# Patient Record
Sex: Male | Born: 2000 | Race: Black or African American | Hispanic: No | Marital: Single
Health system: Southern US, Community
[De-identification: ages and names within clinical notes are randomized; demographics above are authoritative.]

## PROBLEM LIST (undated history)

## (undated) DIAGNOSIS — J45909 Unspecified asthma, uncomplicated: Secondary | ICD-10-CM

## (undated) DIAGNOSIS — L309 Dermatitis, unspecified: Secondary | ICD-10-CM

## (undated) HISTORY — PX: TONSILLECTOMY: SUR1361

---
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
## 2020-02-27 ENCOUNTER — Emergency Department (HOSPITAL_COMMUNITY)
Admission: EM | Admit: 2020-02-27 | Discharge: 2020-02-27 | Disposition: A | Discharge status: Home / Self Care | Payer: Medicaid Other | Attending: Emergency Medicine | Admitting: Emergency Medicine

## 2020-02-27 ENCOUNTER — Other Ambulatory Visit: Payer: Self-pay

## 2020-02-27 ENCOUNTER — Encounter (HOSPITAL_COMMUNITY): Payer: Self-pay | Admitting: Emergency Medicine

## 2020-02-27 DIAGNOSIS — N4889 Other specified disorders of penis: Secondary | ICD-10-CM | POA: Insufficient documentation

## 2020-02-27 DIAGNOSIS — Z202 Contact with and (suspected) exposure to infections with a predominantly sexual mode of transmission: Secondary | ICD-10-CM | POA: Diagnosis not present

## 2020-02-27 LAB — URINALYSIS, ROUTINE W REFLEX MICROSCOPIC
Bacteria, UA: NONE SEEN
Bilirubin Urine: NEGATIVE
Glucose, UA: NEGATIVE mg/dL
Hgb urine dipstick: NEGATIVE
Ketones, ur: NEGATIVE mg/dL
Leukocytes,Ua: NEGATIVE
Nitrite: NEGATIVE
Protein, ur: 30 mg/dL — AB
Specific Gravity, Urine: 1.028 (ref 1.005–1.030)
pH: 6 (ref 5.0–8.0)

## 2020-02-27 LAB — RAPID HIV SCREEN (HIV 1/2 AB+AG)
HIV 1/2 Antibodies: NONREACTIVE
HIV-1 P24 Antigen - HIV24: NONREACTIVE

## 2020-02-27 MED ORDER — DOXYCYCLINE HYCLATE 100 MG PO TABS
100.0000 mg | ORAL_TABLET | Freq: Once | ORAL | Status: AC
  Administered 2020-02-27: 17:00:00 100 mg via ORAL
  Filled 2020-02-27: qty 1

## 2020-02-27 MED ORDER — CEFTRIAXONE SODIUM 1 G IJ SOLR
500.0000 mg | Freq: Once | INTRAMUSCULAR | Status: AC
  Administered 2020-02-27: 500 mg via INTRAMUSCULAR
  Filled 2020-02-27: qty 10

## 2020-02-27 MED ORDER — STERILE WATER FOR INJECTION IJ SOLN
INTRAMUSCULAR | Status: AC
  Administered 2020-02-27: 10 mL
  Filled 2020-02-27: qty 10

## 2020-02-27 MED ORDER — DOXYCYCLINE HYCLATE 100 MG PO CAPS: 100.0000 mg | ORAL_CAPSULE | Freq: Two times a day (BID) | ORAL | 0 refills | Status: AC

## 2020-02-27 NOTE — ED Notes (Signed)
ED Provider at bedside. 

## 2020-02-27 NOTE — ED Notes (Signed)
Patient given water per PA request.

## 2020-02-27 NOTE — ED Provider Notes (Signed)
[name]-EMERGENCY DEPT Provider Note   CSN: [id number] Arrival date & time: 02/27/20  1446     History Chief Complaint  Patient presents with  . Exposure to STD    Daniel Cole is a 19 y.o. male with a past medical history significant for asthma and eczema who presents to the ED due to an STD exposure. Patient admits to being sexually active with 2 females without protection and was informed yesterday one partner tested positive for an STD. Patient admits to history of gonorrhea and chlamydia in the past. Denies abdominal pain, scrotal pain, testicular swelling, pain with defecation, and dysuria. Denies fever and chills. He endorses tingling at the tip of his penis after urination for the past week and a half. He has requested to be tested for gonorrhea, chlamydia, HIV, and syphilis. Denies penile discharge.       Past Medical History:  Diagnosis Date  . Asthma   . Eczema     Patient Active Problem List   Diagnosis Date Noted  . Elevated blood pressure reading 02/01/2017  . Asthma exacerbation 01/31/2017  . Asthma 01/30/2017  . Exacerbation of asthma 01/30/2017    Past Surgical History:  Procedure Laterality Date  . TONSILLECTOMY         History reviewed. No pertinent family history.  Social History   Tobacco Use  . Smoking status: Never Smoker  . Smokeless tobacco: Never Used  Substance Use Topics  . Alcohol use: No  . Drug use: Yes    Types: Marijuana    Home Medications Prior to Admission medications   Medication Sig Start Date End Date Taking? Authorizing Provider  albuterol (PROAIR HFA) 108 (90 Base) MCG/ACT inhaler Inhale 2 puffs into the lungs every 4 (four) hours as needed for wheezing or shortness of breath.    [provider]  beclomethasone (QVAR) 80 MCG/ACT inhaler Inhale 2 puffs into the lungs 2 (two) times daily as needed (shortness of breath).     [provider]  cetirizine (ZYRTEC) 10 MG tablet Take  10 mg by mouth daily as needed for allergies.  05/19/18   [provider]  ciprofloxacin (CIPRO) 500 MG tablet Take 1 tablet (500 mg total) by mouth 2 (two) times daily. 06/19/18   Lorre Nick, MD  doxycycline (VIBRAMYCIN) 100 MG capsule Take 1 capsule (100 mg total) by mouth 2 (two) times daily for 7 days. 02/27/20 03/05/20  Mannie Stabile, PA-C  FLOVENT HFA 220 MCG/ACT inhaler Take 2 puffs by mouth 2 (two) times daily as needed (shortness of breath).  05/19/18   [provider]  ibuprofen (ADVIL,MOTRIN) 200 MG tablet Take 400 mg by mouth every 6 (six) hours as needed for headache.     [provider]  polyethylene glycol powder (MIRALAX) powder Mix 1 capful powder in 6-8 oz drink once daily for 3 days then as needed for flank pain/constipation 12/10/18   Ree Shay, MD    Allergies    Eggs or egg-derived products, Orange juice [orange oil], Other, and Peanut-containing drug products  Review of Systems   Review of Systems  Constitutional: Negative for chills and fever.  Gastrointestinal: Negative for abdominal pain, diarrhea, nausea and vomiting.  Genitourinary: Positive for penile pain (tingling sensation). Negative for discharge, dysuria, genital sores, penile swelling, scrotal swelling and testicular pain.  Musculoskeletal: Negative for back pain.    Physical Exam Updated Vital Signs BP 131/83   Pulse 60   Temp 98.1 F (36.7  C) (Oral)   Resp 15   Ht 5\' 11"  (1.803 m)   Wt 74.8 kg   SpO2 99%   BMI 23.01 kg/m   Physical Exam Vitals and nursing note reviewed. Exam conducted with a chaperone present.  Constitutional:      General: He is not in acute distress.    Appearance: He is not ill-appearing.  HENT:     Head: Normocephalic.  Eyes:     Conjunctiva/sclera: Conjunctivae normal.  Cardiovascular:     Rate and Rhythm: Normal rate and regular rhythm.     Pulses: Normal pulses.     Heart sounds: Normal heart sounds. No murmur. No friction rub. No  gallop.   Pulmonary:     Effort: Pulmonary effort is normal.     Breath sounds: Normal breath sounds.  Abdominal:     General: Abdomen is flat. Bowel sounds are normal. There is no distension.     Palpations: Abdomen is soft.     Tenderness: There is no abdominal tenderness. There is no guarding or rebound.     Comments: Abdomen soft, nondistended, nontender to palpation in all quadrants without guarding or peritoneal signs. No rebound.   Genitourinary:    Penis: Normal and circumcised.      Testes: Normal.        Right: Mass, tenderness or swelling not present.        Left: Mass, tenderness or swelling not present.     Epididymis:     Right: Normal.     Left: Normal.     Comments: Normal circumcised penis with no lesions or discharge. No testicular tenderness or swelling.  Musculoskeletal:     Cervical back: Neck supple.     Comments: Able to move all 4 extremities without difficulty.   Skin:    General: Skin is warm and dry.  Neurological:     General: No focal deficit present.     Mental Status: He is alert.     ED Results / Procedures / Treatments   Labs (all labs ordered are listed, but only abnormal results are displayed) Labs Reviewed  URINALYSIS, ROUTINE W REFLEX MICROSCOPIC - Abnormal; Notable for the following components:      Result Value   Protein, ur 30 (*)    All other components within normal limits  RAPID HIV SCREEN (HIV 1/2 AB+AG)  RPR  GC/CHLAMYDIA PROBE AMP ([place]) NOT AT [facility]    EKG None  Radiology No results found.  Procedures Procedures (including critical care time)  Medications Ordered in ED Medications  cefTRIAXone (ROCEPHIN) injection 500 mg (has no administration in time range)  doxycycline (VIBRA-TABS) tablet 100 mg (has no administration in time range)    ED Course  I have reviewed the triage vital signs and the nursing notes.  Pertinent labs & imaging results that were available during my care of the patient were reviewed  by me and considered in my medical decision making (see chart for details).    MDM Rules/Calculators/A&P                     19 year old male presents to the ED for STD testing and treatment after an STD exposure. Patient is afebrile without abdominal tenderness, abdominal pain or painful bowel movements to indicate prostatitis.  No tenderness to palpation of the testes or epididymis to suggest orchitis or epididymitis.  STD cultures obtained including HIV, syphilis, gonorrhea and chlamydia. UA negative for signs of infection.  Patient to be discharged with instructions to follow up with PCP. Discussed importance of using protection when sexually active. Pt understands that they have GC/Chlamydia cultures pending and that they will need to inform all sexual partners if results return positive. Patient has been treated prophylactically with doxycycline and Rocephin given new CDC guidelines. Strict ED precautions discussed with patient. Patient states understanding and agrees to plan. Patient discharged home in no acute distress and stable vitals  Final Clinical Impression(s) / ED Diagnoses Final diagnoses:  STD exposure    Rx / DC Orders ED Discharge Orders         Ordered    doxycycline (VIBRAMYCIN) 100 MG capsule  2 times daily     02/27/20 1622           Jesusita Oka 02/27/20 1625    Bethann Berkshire, MD 02/28/20 1200

## 2020-02-27 NOTE — ED Triage Notes (Signed)
Pt reports exposure to STD. Reports had sex last week and the partner contacted him today stating she has an STD. States he wants to be tested for Chlamydia and Gonorrhea. Hx of STDs and intercourse with multiple partners. Denies pain, discharge, or odor. Reports dark colored urine.

## 2020-02-27 NOTE — Discharge Instructions (Signed)
As discussed, you were tested for STDs today. You will receive your results in the next few days. You were also treated in the ED. You must finish 7 days worth of doxycyline to finish treatment. Take antibiotic twice a day for 7 days. Finish all antibiotics. Use protection with intercourse. If your test results are positive, your partners will needed to be treated as well. I have included the number for [facility]. Call to schedule an appointment to establish care. Return to the ER for new or worsening symptoms.

## 2020-02-28 LAB — RPR: RPR Ser Ql: NONREACTIVE

## 2020-02-29 LAB — GC/CHLAMYDIA PROBE AMP (~~LOC~~) NOT AT ARMC
Chlamydia: NEGATIVE
Neisseria Gonorrhea: NEGATIVE

## 2020-04-14 ENCOUNTER — Encounter (HOSPITAL_COMMUNITY): Payer: Self-pay | Admitting: Emergency Medicine

## 2020-04-14 ENCOUNTER — Other Ambulatory Visit: Payer: Self-pay

## 2020-04-14 ENCOUNTER — Emergency Department (HOSPITAL_COMMUNITY)
Admission: EM | Admit: 2020-04-14 | Discharge: 2020-04-14 | Disposition: A | Discharge status: Home / Self Care | Payer: Medicaid Other | Attending: Emergency Medicine | Admitting: Emergency Medicine

## 2020-04-14 DIAGNOSIS — Z5321 Procedure and treatment not carried out due to patient leaving prior to being seen by health care provider: Secondary | ICD-10-CM | POA: Insufficient documentation

## 2020-04-14 DIAGNOSIS — R1030 Lower abdominal pain, unspecified: Secondary | ICD-10-CM | POA: Insufficient documentation

## 2020-04-14 DIAGNOSIS — Z113 Encounter for screening for infections with a predominantly sexual mode of transmission: Secondary | ICD-10-CM | POA: Diagnosis not present

## 2020-04-14 NOTE — ED Triage Notes (Signed)
Pt reports wanting to be checked for STD due to some groin pain starting today. States he was seen here about 2 months ago and treated for STD. No pain at the moment because drinking water helped.

## 2020-04-14 NOTE — ED Notes (Signed)
Mother to xray via stretcher with patient/tech

## 2020-04-16 ENCOUNTER — Encounter (HOSPITAL_COMMUNITY): Payer: Self-pay | Admitting: Emergency Medicine

## 2020-04-16 ENCOUNTER — Emergency Department (HOSPITAL_COMMUNITY)
Admission: EM | Admit: 2020-04-16 | Discharge: 2020-04-16 | Disposition: A | Discharge status: Home / Self Care | Payer: Medicaid Other | Attending: Emergency Medicine | Admitting: Emergency Medicine

## 2020-04-16 ENCOUNTER — Other Ambulatory Visit: Payer: Self-pay

## 2020-04-16 DIAGNOSIS — N50812 Left testicular pain: Secondary | ICD-10-CM | POA: Diagnosis not present

## 2020-04-16 DIAGNOSIS — Z79899 Other long term (current) drug therapy: Secondary | ICD-10-CM | POA: Diagnosis not present

## 2020-04-16 DIAGNOSIS — J45909 Unspecified asthma, uncomplicated: Secondary | ICD-10-CM | POA: Diagnosis not present

## 2020-04-16 MED ORDER — CEFTRIAXONE SODIUM 1 G IJ SOLR
500.0000 mg | Freq: Once | INTRAMUSCULAR | Status: AC
  Administered 2020-04-16: 15:00:00 500 mg via INTRAMUSCULAR
  Filled 2020-04-16: qty 10

## 2020-04-16 MED ORDER — DOXYCYCLINE HYCLATE 100 MG PO CAPS: 100.0000 mg | ORAL_CAPSULE | Freq: Two times a day (BID) | ORAL | 0 refills | Status: AC

## 2020-04-16 MED ORDER — LIDOCAINE HCL 2 % IJ SOLN
INTRAMUSCULAR | Status: AC
  Administered 2020-04-16: 400 mg
  Filled 2020-04-16: qty 20

## 2020-04-16 NOTE — Discharge Instructions (Addendum)
You have been treated presumptively today for gonorrhea and you have been prescribed medication to cover for chlamydia.  Please take your antibiotic, as prescribed.  Take with food.  You have been tested today for gonorrhea and chlamydia. These results will be available in approximately 3 days. You may check your MyChart account for results. Please inform all sexual partners of positive results and that they should be tested and treated as well.  Please wait 2 weeks and be sure that you and your partners are symptom free before returning to sexual activity. Please use protection with every sexual encounter.  Follow Up: Please followup with your primary doctor in 3 days for discussion of your diagnoses and further evaluation after today's visit; if you do not have a primary care doctor use the resource guide provided to find one; Please return to the ER for worsening symptoms, high fevers or persistent vomiting.  

## 2020-04-16 NOTE — ED Provider Notes (Signed)
[facility] DEPT Provider Note   CSN: [id number] Arrival date & time: 04/16/20  1312     History Chief Complaint  Patient presents with  . Testicle Pain    left    Daniel Cole is a 19 y.o. male with no relevant past medical history presents to the ED with 3-day history of left testicular discomfort.  Patient states that he has had unprotected sexual intercourse with multiple women in the past couple of months and he suspects STI.  He states that it feels as though he is getting "punched in the testicles".  His symptoms come and go and are worse with ambulation and sitting down.  He feels as though his left testicular discomfort radiates towards his lower abdomen.  He was recently evaluated on 02/27/2020 for STI encounter, but states that he was not symptomatic at that time.  He was tested for HIV and syphilis at that time as well and is not requesting repeat testing at this time.  He denies any fevers or chills, abdominal pain, nausea vomiting, penile discharge, testicular or scrotal swelling, hematochezia, pain with defecation, or other symptoms.  HPI     Past Medical History:  Diagnosis Date  . Asthma   . Eczema     Patient Active Problem List   Diagnosis Date Noted  . Elevated blood pressure reading 02/01/2017  . Asthma exacerbation 01/31/2017  . Asthma 01/30/2017  . Exacerbation of asthma 01/30/2017    Past Surgical History:  Procedure Laterality Date  . TONSILLECTOMY         No family history on file.  Social History   Tobacco Use  . Smoking status: Never Smoker  . Smokeless tobacco: Never Used  Substance Use Topics  . Alcohol use: No  . Drug use: Yes    Types: Marijuana    Home Medications Prior to Admission medications   Medication Sig Start Date End Date Taking? Authorizing Provider  albuterol (PROAIR HFA) 108 (90 Base) MCG/ACT inhaler Inhale 2 puffs into the lungs every 4 (four) hours as needed for wheezing or shortness  of breath.    [provider]  beclomethasone (QVAR) 80 MCG/ACT inhaler Inhale 2 puffs into the lungs 2 (two) times daily as needed (shortness of breath).     [provider]  cetirizine (ZYRTEC) 10 MG tablet Take 10 mg by mouth daily as needed for allergies.  05/19/18   [provider]  ciprofloxacin (CIPRO) 500 MG tablet Take 1 tablet (500 mg total) by mouth 2 (two) times daily. 06/19/18   Lacretia Leigh, MD  doxycycline (VIBRAMYCIN) 100 MG capsule Take 1 capsule (100 mg total) by mouth 2 (two) times daily. 04/16/20   Corena Herter, PA-C  FLOVENT HFA 220 MCG/ACT inhaler Take 2 puffs by mouth 2 (two) times daily as needed (shortness of breath).  05/19/18   [provider]  ibuprofen (ADVIL,MOTRIN) 200 MG tablet Take 400 mg by mouth every 6 (six) hours as needed for headache.     [provider]  polyethylene glycol powder (MIRALAX) powder Mix 1 capful powder in 6-8 oz drink once daily for 3 days then as needed for flank pain/constipation 12/10/18   Harlene Salts, MD    Allergies    Eggs or egg-derived products, Orange juice [orange oil], Other, and Peanut-containing drug products  Review of Systems   Review of Systems  All other systems reviewed and are negative.   Physical Exam Updated Vital Signs BP (!) 155/98  Pulse 67   Temp 98.7 F (37.1 C) (Oral)   Resp 17   SpO2 100%   Physical Exam Vitals and nursing note reviewed. Exam conducted with a chaperone present.  Constitutional:      General: He is not in acute distress.    Appearance: Normal appearance. He is not ill-appearing.  HENT:     Head: Normocephalic and atraumatic.  Eyes:     General: No scleral icterus.    Conjunctiva/sclera: Conjunctivae normal.  Cardiovascular:     Rate and Rhythm: Normal rate and regular rhythm.     Pulses: Normal pulses.     Heart sounds: Normal heart sounds.  Pulmonary:     Effort: Pulmonary effort is normal.     Breath sounds: Normal breath  sounds.  Abdominal:     General: Abdomen is flat. There is no distension.     Palpations: Abdomen is soft.     Tenderness: There is no abdominal tenderness. There is no guarding.  Genitourinary:    Comments: GU exam: No high riding testicle.  Cremasteric reflex intact.  No significant swelling or other overlying skin changes.  Negative Prehn sign.  No penile discharge. Musculoskeletal:     Cervical back: Normal range of motion and neck supple. No rigidity.  Skin:    General: Skin is dry.     Capillary Refill: Capillary refill takes less than 2 seconds.  Neurological:     Mental Status: He is alert and oriented to person, place, and time.     GCS: GCS eye subscore is 4. GCS verbal subscore is 5. GCS motor subscore is 6.  Psychiatric:        Mood and Affect: Mood normal.        Behavior: Behavior normal.        Thought Content: Thought content normal.     ED Results / Procedures / Treatments   Labs (all labs ordered are listed, but only abnormal results are displayed) Labs Reviewed  GC/CHLAMYDIA PROBE AMP ([place]) NOT AT [facility]    EKG None  Radiology No results found.  Procedures Procedures (including critical care time)  Medications Ordered in ED Medications  cefTRIAXone (ROCEPHIN) injection 500 mg (500 mg Intramuscular Given 04/16/20 1452)  lidocaine (XYLOCAINE) 2 % (with pres) injection (400 mg  Given 04/16/20 1452)    ED Course  I have reviewed the triage vital signs and the nursing notes.  Pertinent labs & imaging results that were available during my care of the patient were reviewed by me and considered in my medical decision making (see chart for details).    MDM Rules/Calculators/A&P                      Patient is afebrile without abdominal tenderness, abdominal pain or painful bowel movements to indicate prostatitis.  No tenderness to palpation of the testes or epididymis to suggest orchitis or epididymitis.  STD cultures obtained including gonorrhea and  chlamydia. Patient to be discharged with instructions to follow up with PCP. Discussed importance of using protection when sexually active. Pt understands that they have GC/Chlamydia cultures pending and that they will need to inform all sexual partners if results return positive. Patient has been treated prophylactically with Rocephin and prescribed doxycycline.   Low suspicion for torsion at this time.  Patient is resting comfortably on exam.  Do not feel as though imaging or lab work-up is warranted at this time.  Will treat for epididymitis encourage patient  to follow-up with his primary care provider for ongoing evaluation management.  Discussed safe sex practices with the patient.  Strict ED return precautions discussed.  Patient voices understanding and is agreeable to the plan.  Final Clinical Impression(s) / ED Diagnoses Final diagnoses:  Testicular pain, left    Rx / DC Orders ED Discharge Orders         Ordered    doxycycline (VIBRAMYCIN) 100 MG capsule  2 times daily     04/16/20 1441           Elvera Maria 04/16/20 1459    Derwood Kaplan, MD 04/17/20 [id number]

## 2020-04-16 NOTE — ED Triage Notes (Signed)
Pt reports left testicle pains for couple days. Reports hx being treated for STDs before. Was told by his friend that could be another STI. Denies discharge, drainage or urinary problems.

## 2020-04-17 LAB — GC/CHLAMYDIA PROBE AMP (~~LOC~~) NOT AT ARMC
Chlamydia: NEGATIVE
Comment: NEGATIVE
Comment: NORMAL
Neisseria Gonorrhea: NEGATIVE

## 2020-05-09 ENCOUNTER — Emergency Department (HOSPITAL_COMMUNITY)
Admission: EM | Admit: 2020-05-09 | Discharge: 2020-05-26 | Disposition: E | Payer: Medicaid Other | Attending: Emergency Medicine | Admitting: Emergency Medicine

## 2020-05-09 DIAGNOSIS — S41102A Unspecified open wound of left upper arm, initial encounter: Secondary | ICD-10-CM | POA: Insufficient documentation

## 2020-05-09 DIAGNOSIS — Y939 Activity, unspecified: Secondary | ICD-10-CM | POA: Diagnosis not present

## 2020-05-09 DIAGNOSIS — Y999 Unspecified external cause status: Secondary | ICD-10-CM | POA: Insufficient documentation

## 2020-05-09 DIAGNOSIS — S21119A Laceration without foreign body of unspecified front wall of thorax without penetration into thoracic cavity, initial encounter: Secondary | ICD-10-CM | POA: Diagnosis present

## 2020-05-09 DIAGNOSIS — Y9289 Other specified places as the place of occurrence of the external cause: Secondary | ICD-10-CM | POA: Insufficient documentation

## 2020-05-09 DIAGNOSIS — W3400XA Accidental discharge from unspecified firearms or gun, initial encounter: Secondary | ICD-10-CM

## 2020-05-10 ENCOUNTER — Encounter (HOSPITAL_COMMUNITY): Payer: Self-pay

## 2020-05-10 MED ORDER — SODIUM BICARBONATE 8.4 % IV SOLN
INTRAVENOUS | Status: AC | PRN
  Administered 2020-05-09: 50 meq via INTRAVENOUS

## 2020-05-10 MED ORDER — EPINEPHRINE 1 MG/10ML IJ SOSY
PREFILLED_SYRINGE | INTRAMUSCULAR | Status: AC | PRN
  Administered 2020-05-09: 1 via INTRAVENOUS

## 2020-05-10 MED FILL — Medication: Qty: 1 | Status: AC

## 2020-05-11 LAB — PREPARE FRESH FROZEN PLASMA
Unit division: 0
Unit division: 0
Unit division: 0
Unit division: 0

## 2020-05-11 LAB — BPAM FFP
Blood Product Expiration Date: 202105242359
Blood Product Expiration Date: 202105282359
Blood Product Expiration Date: 202105292359
Blood Product Expiration Date: 202105292359
ISSUE DATE / TIME: 202105152333
ISSUE DATE / TIME: 202105152333
ISSUE DATE / TIME: 202105152353
ISSUE DATE / TIME: 202105152353
Unit Type and Rh: 600
Unit Type and Rh: 6200
Unit Type and Rh: 6200
Unit Type and Rh: 6200

## 2020-05-12 ENCOUNTER — Encounter (HOSPITAL_COMMUNITY): Payer: Self-pay | Admitting: Emergency Medicine

## 2020-05-12 LAB — BPAM RBC
Blood Product Expiration Date: 202105292359
Blood Product Expiration Date: 202105292359
Blood Product Expiration Date: 202106112359
Blood Product Expiration Date: 202106132359
Blood Product Expiration Date: 202106172359
Blood Product Expiration Date: 202106172359
ISSUE DATE / TIME: 202105152330
ISSUE DATE / TIME: 202105152330
ISSUE DATE / TIME: 202105152333
ISSUE DATE / TIME: 202105152333
ISSUE DATE / TIME: 202105152353
ISSUE DATE / TIME: 202105152353
Unit Type and Rh: 5100
Unit Type and Rh: 5100
Unit Type and Rh: 5100
Unit Type and Rh: 5100
Unit Type and Rh: 5100
Unit Type and Rh: 5100

## 2020-05-12 LAB — TYPE AND SCREEN
Unit division: 0
Unit division: 0
Unit division: 0
Unit division: 0
Unit division: 0
Unit division: 0

## 2020-05-26 NOTE — ED Notes (Signed)
Right chest incision made

## 2020-05-26 NOTE — ED Notes (Signed)
Compressions started in the ED at 2335

## 2020-05-26 NOTE — ED Notes (Signed)
Right femoral incision made, triple lumen placed.

## 2020-05-26 NOTE — ED Notes (Signed)
1 apple watch noted upon arrival
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
## 2020-05-26 NOTE — ED Notes (Signed)
Pulse check asystole
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
## 2020-05-26 NOTE — ED Notes (Signed)
Time of death 04-09-2351
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
## 2020-05-26 NOTE — ED Notes (Signed)
Left tibia IO placed by annie rn
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
## 2020-05-26 NOTE — ED Notes (Signed)
asystyole

## 2020-05-26 DEATH — deceased
